# Patient Record
Sex: Female | Born: 1980 | Race: Black or African American | Hispanic: No | Marital: Single | Smoking: Former smoker
Health system: Southern US, Community
[De-identification: ages and names within clinical notes are randomized; demographics above are authoritative.]

## PROBLEM LIST (undated history)

## (undated) DIAGNOSIS — J45909 Unspecified asthma, uncomplicated: Secondary | ICD-10-CM

## (undated) DIAGNOSIS — J40 Bronchitis, not specified as acute or chronic: Secondary | ICD-10-CM

## (undated) HISTORY — PX: WISDOM TOOTH EXTRACTION: SHX21

---

## 2012-07-02 ENCOUNTER — Emergency Department (HOSPITAL_COMMUNITY)
Admission: EM | Admit: 2012-07-02 | Discharge: 2012-07-02 | Disposition: A | Discharge status: Home / Self Care | Payer: Self-pay | Attending: Emergency Medicine | Admitting: Emergency Medicine

## 2012-07-02 ENCOUNTER — Encounter (HOSPITAL_COMMUNITY): Payer: Self-pay | Admitting: *Deleted

## 2012-07-02 DIAGNOSIS — Z8709 Personal history of other diseases of the respiratory system: Secondary | ICD-10-CM | POA: Insufficient documentation

## 2012-07-02 DIAGNOSIS — J45909 Unspecified asthma, uncomplicated: Secondary | ICD-10-CM | POA: Insufficient documentation

## 2012-07-02 DIAGNOSIS — H6120 Impacted cerumen, unspecified ear: Secondary | ICD-10-CM | POA: Insufficient documentation

## 2012-07-02 DIAGNOSIS — F17200 Nicotine dependence, unspecified, uncomplicated: Secondary | ICD-10-CM | POA: Insufficient documentation

## 2012-07-02 HISTORY — DX: Bronchitis, not specified as acute or chronic: J40

## 2012-07-02 HISTORY — DX: Unspecified asthma, uncomplicated: J45.909

## 2012-07-02 MED ORDER — OXYCODONE-ACETAMINOPHEN 5-325 MG PO TABS
1.0000 | ORAL_TABLET | Freq: Once | ORAL | Status: AC
  Administered 2012-07-02: 1 via ORAL
  Filled 2012-07-02: qty 1

## 2012-07-02 NOTE — ED Notes (Signed)
Patient presents with c/o right ear ache.  Have tried flushing the ear with peroxide, ear wax drops, etc without success.  Denies drainage from ear.  States it hurts more when she is outside in the wind.

## 2012-07-02 NOTE — ED Provider Notes (Signed)
History     CSN: [id number]  Arrival date & time 07/02/12  0459   First MD Initiated Contact with Patient 07/02/12 [phone]      Chief Complaint  Patient presents with  . Otalgia    Patient is a 32 y.o. female presenting with ear pain. The history is provided by the patient.  Otalgia This is a new problem. The current episode started yesterday. There is pain in the right ear. The problem occurs constantly. The problem has been gradually worsening. There has been no fever. The pain is moderate. Pertinent negatives include no ear discharge, no headaches and no hearing loss.    Past Medical History  Diagnosis Date  . Asthma   . Bronchitis     History reviewed. No pertinent past surgical history.  History reviewed. No pertinent family history.  History  Substance Use Topics  . Smoking status: Current Every Day Smoker  . Smokeless tobacco: Not on file  . Alcohol Use: No    OB History    Grav Para Term Preterm Abortions TAB SAB Ect Mult Living                  Review of Systems  HENT: Positive for ear pain. Negative for hearing loss and ear discharge.   Neurological: Negative for headaches.    Allergies  Review of patient's allergies indicates no known allergies.  Home Medications  No current outpatient prescriptions on file.  BP 105/69  Pulse 72  Resp 18  SpO2 100%  Physical Exam CONSTITUTIONAL: Well developed/well nourished HEAD AND FACE: Normocephalic/atraumatic ENMT: Mucous membranes moist. Cerumen impaction noted to right ear canal.  Cerumen noted to left ear NECK: supple no meningeal signs CV: S1/S2 noted, no murmurs/rubs/gallops noted LUNGS: Lungs are clear to auscultation bilaterally, no apparent distress ABDOMEN: soft, nontender, no rebound or guarding NEURO: Pt is awake/alert, moves all extremitiesx4 EXTREMITIES: pulses normal, full ROM SKIN: warm, color normal PSYCH: no abnormalities of mood noted  ED Course  Procedures   Ear irrigation to  right ear by nurse.  Pt tolerated well.  She still has cerumen noted but is improved.  She requests d/c home.    1. Cerumen impaction       MDM  Nursing notes including past medical history and social history reviewed and considered in documentation         Joya Gaskins, MD 07/02/12 [phone]

## 2012-07-02 NOTE — ED Notes (Signed)
Pt states her right ear has been hurting for the past 2 days. Pt has been trying to flush out ear with peroxide and ear wax removal drops. Pt states that she wasn't able to sleep due to pain and pain increases with wind etc.

## 2012-07-02 NOTE — ED Notes (Signed)
Left ear irrigated with water and peroxide.  Several large pieces of wax removed.  Patient tolerated procedure well.

## 2013-08-03 ENCOUNTER — Encounter (HOSPITAL_COMMUNITY): Payer: Self-pay | Admitting: Emergency Medicine

## 2013-08-03 ENCOUNTER — Emergency Department (HOSPITAL_COMMUNITY)
Admission: EM | Admit: 2013-08-03 | Discharge: 2013-08-03 | Disposition: A | Discharge status: Home / Self Care | Payer: Self-pay | Attending: Emergency Medicine | Admitting: Emergency Medicine

## 2013-08-03 ENCOUNTER — Telehealth (HOSPITAL_COMMUNITY): Payer: Self-pay

## 2013-08-03 DIAGNOSIS — F17200 Nicotine dependence, unspecified, uncomplicated: Secondary | ICD-10-CM | POA: Insufficient documentation

## 2013-08-03 DIAGNOSIS — B309 Viral conjunctivitis, unspecified: Secondary | ICD-10-CM | POA: Insufficient documentation

## 2013-08-03 DIAGNOSIS — J45909 Unspecified asthma, uncomplicated: Secondary | ICD-10-CM | POA: Insufficient documentation

## 2013-08-03 DIAGNOSIS — H109 Unspecified conjunctivitis: Secondary | ICD-10-CM

## 2013-08-03 MED ORDER — NEOMYCIN-POLYMYXIN-HC 3.5-10000-1 OP SUSP: 2.0000 [drp] | OPHTHALMIC | Status: DC

## 2013-08-03 NOTE — ED Notes (Signed)
Pharmacy calling having difficult time finding eye solution.  [facility].

## 2013-08-03 NOTE — ED Provider Notes (Signed)
10:46 AM 08/03/2013 - This provider received a phone call from the Flow Manager who reported that patient cannot get medication for her conjunctivitis secondary to medication not being in stock and patient is unable to afford the medication. This provider spoke with pharmacist, Silverio LayYao - order placed for Erythromycin ophthalmic ointment to be placed 4 times per day to affected right eye for 5 days. Patient was able to afford and this medication was in stock.   Raymon MuttonMarissa Nissim Fleischer, PA-C 08/03/13 1808

## 2013-08-03 NOTE — ED Provider Notes (Signed)
Medical screening examination/treatment/procedure(s) were performed by non-physician practitioner and as supervising physician I was immediately available for consultation/collaboration.  EKG Interpretation   None        Ethelda ChickMartha K Linker, MD 08/03/13 [phone]

## 2013-08-03 NOTE — ED Notes (Signed)
Pt. reports bilateral eye redness with drainage for several days , denies injury , slight blurred vision .

## 2013-08-03 NOTE — Discharge Instructions (Signed)

## 2013-08-03 NOTE — ED Provider Notes (Signed)
CSN: [id number]     Arrival date & time 08/03/13  0004 History   None    Chief Complaint  Patient presents with  . Conjunctivitis   (Consider location/radiation/quality/duration/timing/severity/associated sxs/prior Treatment) HPI Comments: Patient with complaint of bilateral eye redness, with swelling, eye mattering.  Began on the right I personally 7 days ago and progressed to left.  Boyfriend with the same symptoms.  Patient with complaint of blurry vision in the right eye.  Visual acuity is equal bilaterally in both eyes.  Patient is a 33 y.o. female presenting with conjunctivitis. The history is provided by the patient. No language interpreter was used.  Conjunctivitis This is a new problem. The current episode started in the past 7 days. The problem occurs constantly. The problem has been gradually worsening. Pertinent negatives include no abdominal pain, anorexia, arthralgias, change in bowel habit, chest pain, chills, congestion, coughing, diaphoresis, fatigue, fever, headaches, joint swelling, myalgias, nausea, neck pain, numbness, rash, sore throat, swollen glands, urinary symptoms, vertigo, visual change, vomiting or weakness. Nothing aggravates the symptoms. She has tried nothing for the symptoms.    Past Medical History  Diagnosis Date  . Asthma   . Bronchitis    History reviewed. No pertinent past surgical history. No family history on file. History  Substance Use Topics  . Smoking status: Current Every Day Smoker  . Smokeless tobacco: Not on file  . Alcohol Use: No   OB History   Grav Para Term Preterm Abortions TAB SAB Ect Mult Living                 Review of Systems  Constitutional: Negative for fever, chills, diaphoresis and fatigue.  HENT: Negative for congestion and sore throat.   Eyes: Positive for discharge, redness, itching and visual disturbance. Negative for photophobia and pain.  Respiratory: Negative for cough.   Cardiovascular: Negative for chest pain.   Gastrointestinal: Negative for nausea, vomiting, abdominal pain, anorexia and change in bowel habit.  Musculoskeletal: Negative for arthralgias, joint swelling, myalgias and neck pain.  Skin: Negative for rash.  Neurological: Negative for vertigo, weakness, numbness and headaches.    Allergies  Review of patient's allergies indicates no known allergies.  Home Medications   Current Outpatient Rx  Name  Route  Sig  Dispense  Refill  . diphenhydramine-acetaminophen (TYLENOL PM) 25-500 MG TABS   Oral   Take 1 tablet by mouth at bedtime as needed (for sleep).          BP 108/70  Pulse 82  Temp(Src) 98 F (36.7 C) (Oral)  Resp 18  SpO2 100%  LMP 08/01/2013 Physical Exam  Constitutional: She is oriented to person, place, and time. She appears well-developed and well-nourished. No distress.  HENT:  Head: Normocephalic and atraumatic.  Eyes: EOM are normal. Pupils are equal, round, and reactive to light. Right eye exhibits discharge. Right eye exhibits no exudate. Left eye exhibits discharge. Left eye exhibits no exudate. Right conjunctiva is injected. Left conjunctiva is injected. No scleral icterus.  Neck: Normal range of motion.  Cardiovascular: Normal rate, regular rhythm and normal heart sounds.  Exam reveals no gallop and no friction rub.   No murmur heard. Pulmonary/Chest: Effort normal and breath sounds normal. No respiratory distress.  Abdominal: Soft. Bowel sounds are normal. She exhibits no distension and no mass. There is no tenderness. There is no guarding.  Neurological: She is alert and oriented to person, place, and time.  Skin: Skin is warm and dry. She is  not diaphoretic.    ED Course  Procedures (including critical care time) Labs Review Labs Reviewed - No data to display Imaging Review No results found.  EKG Interpretation   None       MDM   1. Conjunctivitis   Viral conjunctivitis  Patient presentation consistent with viral conjunctivitis.  No  purulent discharge, corneal abrasions, entrapment, consensual photophobia, or dendritic staining with fluorescein study.  Presentation non-concerning for iritis, bacterial conjunctivitis, corneal abrasions, or HSV. Personal hygiene and frequent handwashing discussed.  Patient advised to followup with ophthalmologist if symptoms persist or worsen in any way including vision change or purulent discharge.  Patient verbalizes understanding and is agreeable with plan.    Arthor CaptainAbigail Calan Doren, PA-C 08/03/13 0119

## 2013-08-08 NOTE — ED Provider Notes (Signed)
Medical screening examination/treatment/procedure(s) were performed by non-physician practitioner and as supervising physician I was immediately available for consultation/collaboration.  EKG Interpretation   None        Raeford RazorStephen Chevon Laufer, MD 08/08/13 1450

## 2013-11-07 ENCOUNTER — Encounter (HOSPITAL_COMMUNITY): Payer: Self-pay | Admitting: Emergency Medicine

## 2013-11-07 ENCOUNTER — Emergency Department (HOSPITAL_COMMUNITY)
Admission: EM | Admit: 2013-11-07 | Discharge: 2013-11-07 | Discharge status: Against Medical Advice | Payer: Self-pay | Attending: Emergency Medicine | Admitting: Emergency Medicine

## 2013-11-07 DIAGNOSIS — R51 Headache: Secondary | ICD-10-CM | POA: Insufficient documentation

## 2013-11-07 DIAGNOSIS — J45909 Unspecified asthma, uncomplicated: Secondary | ICD-10-CM | POA: Insufficient documentation

## 2013-11-07 DIAGNOSIS — R059 Cough, unspecified: Secondary | ICD-10-CM | POA: Insufficient documentation

## 2013-11-07 DIAGNOSIS — F17200 Nicotine dependence, unspecified, uncomplicated: Secondary | ICD-10-CM | POA: Insufficient documentation

## 2013-11-07 DIAGNOSIS — M549 Dorsalgia, unspecified: Secondary | ICD-10-CM | POA: Insufficient documentation

## 2013-11-07 DIAGNOSIS — R630 Anorexia: Secondary | ICD-10-CM | POA: Insufficient documentation

## 2013-11-07 DIAGNOSIS — R05 Cough: Secondary | ICD-10-CM | POA: Insufficient documentation

## 2013-11-07 DIAGNOSIS — R52 Pain, unspecified: Secondary | ICD-10-CM | POA: Insufficient documentation

## 2013-11-07 NOTE — ED Notes (Addendum)
Pt complains of flu-like symptoms, productive cough and generalized body aches worse in L side of head and back. Decreased appetite, but able to keep down fluids. No sore throat

## 2013-11-07 NOTE — ED Provider Notes (Signed)
Patient left after triage.  I did not see or evaluate patient.  Garlon HatchetLisa M Jaymond Waage, PA-C 11/07/13 1739  Rolland PorterMark James, MD 07/24/14 [phone]

## 2013-11-07 NOTE — ED Notes (Signed)
Pt not in room when PA went to see pt. Pt walked out of ED.

## 2013-11-08 ENCOUNTER — Emergency Department (INDEPENDENT_AMBULATORY_CARE_PROVIDER_SITE_OTHER)
Admission: EM | Admit: 2013-11-08 | Discharge: 2013-11-08 | Disposition: A | Discharge status: Home / Self Care | Payer: Self-pay | Source: Home / Self Care | Attending: Family Medicine | Admitting: Family Medicine

## 2013-11-08 ENCOUNTER — Encounter (HOSPITAL_COMMUNITY): Payer: Self-pay | Admitting: Emergency Medicine

## 2013-11-08 DIAGNOSIS — J45909 Unspecified asthma, uncomplicated: Secondary | ICD-10-CM

## 2013-11-08 DIAGNOSIS — J329 Chronic sinusitis, unspecified: Secondary | ICD-10-CM

## 2013-11-08 MED ORDER — ALBUTEROL SULFATE HFA 108 (90 BASE) MCG/ACT IN AERS
INHALATION_SPRAY | RESPIRATORY_TRACT | Status: AC
Start: 2013-11-08 — End: 2013-11-08
  Filled 2013-11-08: qty 6.7

## 2013-11-08 MED ORDER — AEROCHAMBER PLUS FLO-VU LARGE MISC
1.0000 | Freq: Once | Status: AC
  Administered 2013-11-08: 1

## 2013-11-08 MED ORDER — IPRATROPIUM-ALBUTEROL 0.5-2.5 (3) MG/3ML IN SOLN
3.0000 mL | Freq: Once | RESPIRATORY_TRACT | Status: AC
  Administered 2013-11-08: 3 mL via RESPIRATORY_TRACT

## 2013-11-08 MED ORDER — ALBUTEROL SULFATE HFA 108 (90 BASE) MCG/ACT IN AERS
1.0000 | INHALATION_SPRAY | RESPIRATORY_TRACT | Status: DC | PRN
Start: 2013-11-08 — End: 2013-11-08
  Administered 2013-11-08: 2 via RESPIRATORY_TRACT

## 2013-11-08 MED ORDER — PREDNISONE 10 MG PO TABS: ORAL_TABLET | ORAL | Status: DC

## 2013-11-08 MED ORDER — AMOXICILLIN 875 MG PO TABS: 875.0000 mg | ORAL_TABLET | Freq: Two times a day (BID) | ORAL | Status: DC

## 2013-11-08 MED ORDER — FLUTICASONE PROPIONATE 50 MCG/ACT NA SUSP: 2.0000 | Freq: Two times a day (BID) | NASAL | Status: DC

## 2013-11-08 MED ORDER — IPRATROPIUM-ALBUTEROL 0.5-2.5 (3) MG/3ML IN SOLN
RESPIRATORY_TRACT | Status: AC
  Filled 2013-11-08: qty 3

## 2013-11-08 NOTE — ED Notes (Signed)
Accessed record for patient call-2 of 3 scripts are at pharmacy, no prednisone at pharmacy

## 2013-11-08 NOTE — ED Provider Notes (Signed)
CSN: [id number]     Arrival date & time 11/08/13  [id number] History   First MD Initiated Contact with Patient 11/08/13 [phone]     Chief Complaint  Patient presents with  . Facial Pain   (Consider location/radiation/quality/duration/timing/severity/associated sxs/prior Treatment) HPI Comments: 33 year old female presents complaining of one week of severe left-sided headache, sinus pressure, facial swelling, fatigue, sore throat. Her symptoms have been gradually worsening. She also has decreased appetite and a productive cough. She has been taking over-the-counter medications without relief of her symptoms. She has a history of "bad sinuses," stating that she occasionally gets sinus infections. No recent travel or sick contacts.   Past Medical History  Diagnosis Date  . Asthma   . Bronchitis    History reviewed. No pertinent past surgical history. History reviewed. No pertinent family history. History  Substance Use Topics  . Smoking status: Current Every Day Smoker  . Smokeless tobacco: Not on file  . Alcohol Use: No   OB History   Grav Para Term Preterm Abortions TAB SAB Ect Mult Living                 Review of Systems  Constitutional: Positive for chills and fatigue.  HENT: Positive for congestion, facial swelling, postnasal drip, rhinorrhea, sinus pressure and sore throat. Negative for ear pain.   Eyes: Positive for photophobia.  Respiratory: Positive for cough. Negative for shortness of breath.   Cardiovascular: Negative for chest pain.  Gastrointestinal: Negative for nausea, vomiting, abdominal pain and diarrhea.  Musculoskeletal: Negative for neck pain.  Skin: Negative for rash.  All other systems reviewed and are negative.   Allergies  Review of patient's allergies indicates no known allergies.  Home Medications   Prior to Admission medications   Medication Sig Start Date End Date Taking? Authorizing Provider  Aspirin-Acetaminophen-Caffeine (GOODY HEADACHE PO) Take 1  Package by mouth every 6 (six) hours as needed (pain).    Historical Provider, MD   BP 112/72  Pulse 108  Temp(Src) 98.5 F (36.9 C) (Oral)  Resp 14  SpO2 100%  LMP 10/30/2013 Physical Exam  Nursing note and vitals reviewed. Constitutional: She is oriented to person, place, and time. Vital signs are normal. She appears well-developed and well-nourished. No distress.  HENT:  Head: Normocephalic and atraumatic.  Nose: Mucosal edema and rhinorrhea present. Right sinus exhibits no maxillary sinus tenderness and no frontal sinus tenderness. Left sinus exhibits frontal sinus tenderness. Left sinus exhibits no maxillary sinus tenderness.  Mouth/Throat: Uvula is midline, oropharynx is clear and moist and mucous membranes are normal.  Eyes: Conjunctivae are normal. Right eye exhibits no discharge. Left eye exhibits no discharge.  Neck: Normal range of motion. Neck supple.  Cardiovascular: Normal rate, regular rhythm and normal heart sounds.   Pulmonary/Chest: Effort normal. No accessory muscle usage. Not tachypneic. No respiratory distress. She has wheezes (scattered). She has no rhonchi. She has no rales.  Lymphadenopathy:    She has no cervical adenopathy.  Neurological: She is alert and oriented to person, place, and time. She has normal strength. Coordination normal.  Skin: Skin is warm and dry. No rash noted. She is not diaphoretic.  Psychiatric: She has a normal mood and affect. Judgment normal.    ED Course  Procedures (including critical care time) Labs Review Labs Reviewed - No data to display  Imaging Review No results found.  Adventitious lung sounds completely resolved with one breathing treatment with DuoNeb.    MDM   1. Sinusitis  2. RAD (reactive airway disease)    Treating for sinusitis, will also provide albuterol inhaler airway disease associated with this illness. Followup if no improvement in a few days.  Meds ordered this encounter  Medications  .  ipratropium-albuterol (DUONEB) 0.5-2.5 (3) MG/3ML nebulizer solution 3 mL    Sig:   . albuterol (PROVENTIL HFA;VENTOLIN HFA) 108 (90 BASE) MCG/ACT inhaler 1-2 puff    Sig:   . AEROCHAMBER PLUS FLO-VU LARGE MISC 1 each    Sig:   . amoxicillin (AMOXIL) 875 MG tablet    Sig: Take 1 tablet (875 mg total) by mouth 2 (two) times daily.    Dispense:  14 tablet    Refill:  0    Order Specific Question:  Supervising Provider    Answer:  Clementeen GrahamOREY, EVAN, S [id number]]  . predniSONE (DELTASONE) 10 MG tablet    Sig: 4 tabs PO QD for 4 days; 3 tabs PO QD for 3 days; 2 tabs PO QD for 2 days; 1 tab PO QD for 1 day    Dispense:  30 tablet    Refill:  0    Order Specific Question:  Supervising Provider    Answer:  Clementeen GrahamOREY, EVAN, S [[id number]]  . fluticasone (FLONASE) 50 MCG/ACT nasal spray    Sig: Place 2 sprays into both nostrils 2 (two) times daily. Decrease to 2 sprays/nostril daily after 5 days    Dispense:  16 g    Refill:  2    Order Specific Question:  Supervising Provider    Answer:  Clementeen GrahamOREY, EVAN, Kathie RhodesS [[id number]]       Graylon GoodZachary H Maddyx Wieck, PA-C 11/08/13 [phone]

## 2013-11-08 NOTE — Discharge Instructions (Signed)
Sinusitis Sinusitis is redness, soreness, and swelling (inflammation) of the paranasal sinuses. Paranasal sinuses are air pockets within the bones of your face (beneath the eyes, the middle of the forehead, or above the eyes). In healthy paranasal sinuses, mucus is able to drain out, and air is able to circulate through them by way of your nose. However, when your paranasal sinuses are inflamed, mucus and air can become trapped. This can allow bacteria and other germs to grow and cause infection. Sinusitis can develop quickly and last only a short time (acute) or continue over a long period (chronic). Sinusitis that lasts for more than 12 weeks is considered chronic.  CAUSES  Causes of sinusitis include:  Allergies.  Structural abnormalities, such as displacement of the cartilage that separates your nostrils (deviated septum), which can decrease the air flow through your nose and sinuses and affect sinus drainage.  Functional abnormalities, such as when the small hairs (cilia) that line your sinuses and help remove mucus do not work properly or are not present. SYMPTOMS  Symptoms of acute and chronic sinusitis are the same. The primary symptoms are pain and pressure around the affected sinuses. Other symptoms include:  Upper toothache.  Earache.  Headache.  Bad breath.  Decreased sense of smell and taste.  A cough, which worsens when you are lying flat.  Fatigue.  Fever.  Thick drainage from your nose, which often is green and may contain pus (purulent).  Swelling and warmth over the affected sinuses. DIAGNOSIS  Your caregiver will perform a physical exam. During the exam, your caregiver may:  Look in your nose for signs of abnormal growths in your nostrils (nasal polyps).  Tap over the affected sinus to check for signs of infection.  View the inside of your sinuses (endoscopy) with a special imaging device with a light attached (endoscope), which is inserted into your  sinuses. If your caregiver suspects that you have chronic sinusitis, one or more of the following tests may be recommended:  Allergy tests.  Nasal culture A sample of mucus is taken from your nose and sent to a lab and screened for bacteria.  Nasal cytology A sample of mucus is taken from your nose and examined by your caregiver to determine if your sinusitis is related to an allergy. TREATMENT  Most cases of acute sinusitis are related to a viral infection and will resolve on their own within 10 days. Sometimes medicines are prescribed to help relieve symptoms (pain medicine, decongestants, nasal steroid sprays, or saline sprays).  However, for sinusitis related to a bacterial infection, your caregiver will prescribe antibiotic medicines. These are medicines that will help kill the bacteria causing the infection.  Rarely, sinusitis is caused by a fungal infection. In theses cases, your caregiver will prescribe antifungal medicine. For some cases of chronic sinusitis, surgery is needed. Generally, these are cases in which sinusitis recurs more than 3 times per year, despite other treatments. HOME CARE INSTRUCTIONS   Drink plenty of water. Water helps thin the mucus so your sinuses can drain more easily.  Use a humidifier.  Inhale steam 3 to 4 times a day (for example, sit in the bathroom with the shower running).  Apply a warm, moist washcloth to your face 3 to 4 times a day, or as directed by your caregiver.  Use saline nasal sprays to help moisten and clean your sinuses.  Take over-the-counter or prescription medicines for pain, discomfort, or fever only as directed by your caregiver. [place]  CARE IF:  You have increasing pain or severe headaches.  You have nausea, vomiting, or drowsiness.  You have swelling around your face.  You have vision problems.  You have a stiff neck.  You have difficulty breathing. MAKE SURE YOU:   Understand these  instructions.  Will watch your condition.  Will get help right away if you are not doing well or get worse. Document Released: 06/17/2005 Document Revised: 09/09/2011 Document Reviewed: 07/02/2011 [facility] Patient Information 2014 [place], [place].  Bronchospasm, Adult A bronchospasm is a spasm or tightening of the airways going into the lungs. During a bronchospasm breathing becomes more difficult because the airways get smaller. When this happens there can be coughing, a whistling sound when breathing (wheezing), and difficulty breathing. Bronchospasm is often associated with asthma, but not all patients who experience a bronchospasm have asthma. CAUSES  A bronchospasm is caused by inflammation or irritation of the airways. The inflammation or irritation may be triggered by:   Allergies (such as to animals, pollen, food, or mold). Allergens that cause bronchospasm may cause wheezing immediately after exposure or many hours later.   Infection. Viral infections are believed to be the most common cause of bronchospasm.   Exercise.   Irritants (such as pollution, cigarette smoke, strong odors, aerosol sprays, and paint fumes).   Weather changes. Winds increase molds and pollens in the air. Rain refreshes the air by washing irritants out. Cold air may cause inflammation.   Stress and emotional upset.  SIGNS AND SYMPTOMS   Wheezing.   Excessive nighttime coughing.   Frequent or severe coughing with a simple cold.   Chest tightness.   Shortness of breath.  DIAGNOSIS  Bronchospasm is usually diagnosed through a history and physical exam. Tests, such as chest X-rays, are sometimes done to look for other conditions. TREATMENT   Inhaled medicines can be given to open up your airways and help you breathe. The medicines can be given using either an inhaler or a nebulizer machine.  Corticosteroid medicines may be given for severe bronchospasm, usually when it is associated with  asthma. HOME CARE INSTRUCTIONS   Always have a plan prepared for seeking medical care. Know when to call your health care provider and local emergency services (911 in the [place] Know where you can access local emergency care.  Only take medicines as directed by your health care provider.  If you were prescribed an inhaler or nebulizer machine, ask your health care provider to explain how to use it correctly. Always use a spacer with your inhaler if you were given one.  It is necessary to remain calm during an attack. Try to relax and breathe more slowly.  Control your home environment in the following ways:   Change your heating and air conditioning filter at least once a month.   Limit your use of fireplaces and wood stoves.  Do not smoke and do not allow smoking in your home.   Avoid exposure to perfumes and fragrances.   Get rid of pests (such as roaches and mice) and their droppings.   Throw away plants if you see mold on them.   Keep your house clean and dust free.   Replace carpet with wood, tile, or vinyl flooring. Carpet can trap dander and dust.   Use allergy-proof pillows, mattress covers, and box spring covers.   Wash bed sheets and blankets every week in hot water and dry them in a dryer.   Use blankets that are made of polyester  or cotton.   Wash hands frequently. SEEK MEDICAL CARE IF:   You have muscle aches.   You have chest pain.   The sputum changes from clear or white to yellow, green, gray, or bloody.   The sputum you cough up gets thicker.   There are problems that may be related to the medicine you are given, such as a rash, itching, swelling, or trouble breathing.  SEEK IMMEDIATE MEDICAL CARE IF:   You have worsening wheezing and coughing even after taking your prescribed medicines.   You have increased difficulty breathing.   You develop severe chest pain. MAKE SURE YOU:   Understand these instructions.  Will watch  your condition.  Will get help right away if you are not doing well or get worse. Document Released: 06/20/2003 Document Revised: 02/17/2013 Document Reviewed: 12/07/2012 [facility] Patient Information 2014 [place], [place].

## 2013-11-08 NOTE — ED Notes (Signed)
C/o  Sinus pressure and pain.   Left sided facial swelling.  Productive cough with green/yellow sputum.  Decreased appetite.  X 1 wk.  Denies n/v/d.  No relief with otc meds.

## 2013-11-11 NOTE — ED Provider Notes (Signed)
Medical screening examination/treatment/procedure(s) were performed by a resident physician or non-physician practitioner and as the supervising physician I was immediately available for consultation/collaboration.  Sherryll Skoczylas, MD    Tanganika Barradas S Cammi Consalvo, MD 11/11/13 0815 

## 2015-03-15 ENCOUNTER — Inpatient Hospital Stay (HOSPITAL_COMMUNITY)
Admission: AD | Admit: 2015-03-15 | Discharge: 2015-03-15 | Disposition: A | Discharge status: Home / Self Care | Payer: Medicaid Other | Source: Ambulatory Visit | Attending: Obstetrics & Gynecology | Admitting: Obstetrics & Gynecology

## 2015-03-15 ENCOUNTER — Encounter (HOSPITAL_COMMUNITY): Payer: Self-pay | Admitting: Emergency Medicine

## 2015-03-15 ENCOUNTER — Emergency Department (INDEPENDENT_AMBULATORY_CARE_PROVIDER_SITE_OTHER)
Admission: EM | Admit: 2015-03-15 | Discharge: 2015-03-15 | Disposition: A | Discharge status: Home / Self Care | Payer: Self-pay | Source: Home / Self Care | Attending: Family Medicine | Admitting: Family Medicine

## 2015-03-15 ENCOUNTER — Encounter (HOSPITAL_COMMUNITY): Payer: Self-pay | Admitting: *Deleted

## 2015-03-15 ENCOUNTER — Inpatient Hospital Stay (HOSPITAL_COMMUNITY): Payer: Medicaid Other

## 2015-03-15 DIAGNOSIS — F17200 Nicotine dependence, unspecified, uncomplicated: Secondary | ICD-10-CM | POA: Diagnosis not present

## 2015-03-15 DIAGNOSIS — R102 Pelvic and perineal pain: Secondary | ICD-10-CM

## 2015-03-15 DIAGNOSIS — R109 Unspecified abdominal pain: Secondary | ICD-10-CM | POA: Insufficient documentation

## 2015-03-15 DIAGNOSIS — R51 Headache: Secondary | ICD-10-CM | POA: Diagnosis not present

## 2015-03-15 DIAGNOSIS — Z7982 Long term (current) use of aspirin: Secondary | ICD-10-CM | POA: Diagnosis not present

## 2015-03-15 DIAGNOSIS — A599 Trichomoniasis, unspecified: Secondary | ICD-10-CM

## 2015-03-15 DIAGNOSIS — R1033 Periumbilical pain: Secondary | ICD-10-CM

## 2015-03-15 LAB — WET PREP, GENITAL: YEAST WET PREP: NONE SEEN

## 2015-03-15 LAB — POCT URINALYSIS DIP (DEVICE)
BILIRUBIN URINE: NEGATIVE
GLUCOSE, UA: NEGATIVE mg/dL
Ketones, ur: NEGATIVE mg/dL
NITRITE: NEGATIVE
Protein, ur: NEGATIVE mg/dL
Specific Gravity, Urine: 1.02 (ref 1.005–1.030)
UROBILINOGEN UA: 2 mg/dL — AB (ref 0.0–1.0)
pH: 6.5 (ref 5.0–8.0)

## 2015-03-15 LAB — CBC
HCT: 42.7 % (ref 36.0–46.0)
Hemoglobin: 15.1 g/dL — ABNORMAL HIGH (ref 12.0–15.0)
MCH: 33.9 pg (ref 26.0–34.0)
MCHC: 35.4 g/dL (ref 30.0–36.0)
MCV: 95.7 fL (ref 78.0–100.0)
PLATELETS: 225 10*3/uL (ref 150–400)
RBC: 4.46 MIL/uL (ref 3.87–5.11)
RDW: 12.4 % (ref 11.5–15.5)
WBC: 16.6 10*3/uL — ABNORMAL HIGH (ref 4.0–10.5)

## 2015-03-15 LAB — POCT PREGNANCY, URINE: PREG TEST UR: NEGATIVE

## 2015-03-15 MED ORDER — METRONIDAZOLE 500 MG PO TABS
2000.0000 mg | ORAL_TABLET | Freq: Once | ORAL | Status: AC
  Administered 2015-03-15: 2000 mg via ORAL
  Filled 2015-03-15: qty 4

## 2015-03-15 MED ORDER — ONDANSETRON 8 MG PO TBDP
8.0000 mg | ORAL_TABLET | Freq: Once | ORAL | Status: AC
  Administered 2015-03-15: 8 mg via ORAL
  Filled 2015-03-15: qty 1

## 2015-03-15 NOTE — ED Notes (Signed)
General abdominal pain and reports nausea and vomiting that started Sunday 9/11.  Denies diarrhea, denies urinary symptoms, poor appetite.  Last bm normal, last bm was yesterday

## 2015-03-15 NOTE — MAU Provider Note (Signed)
History     CSN: [id number]  Arrival date and time: 03/15/15 4098   First Provider Initiated Contact with Patient 03/15/15 2017      Chief Complaint  Patient presents with  . Abdominal Pain   HPI Caitlin Owens 34 y.o. [id number] nonpregnant female presents complaining of abdominal pain that started 4 days ago.  It feels like menstrual cramps and her period was ongoing at that time.  However her period is nearly over and the cramping is worsening.  She has tried Tylenol, Midol, Advil, Advil pm without good relief.  Pain worse at night.  She is unable to keep any food down.  She vomited 4 times today.  She ate bacon and eggs, bowl of cereal, stir fry, all which were vomited back up.  She was able to keep down crackers and ginger ale mid-day.  She is not on any hormones or other contraceptive.  She has headaches routinely.  She denies weakness, SOB, CP, syncope.  No meds since 3am today.  No concerns with urinating or pooping. OB History    Gravida Para Term Preterm AB TAB SAB Ectopic Multiple Living   Past Medical History  Diagnosis Date  . Asthma   . Bronchitis     Past Surgical History  Procedure Laterality Date  . Wisdom tooth extraction      No family history on file.  Social History  Substance Use Topics  . Smoking status: Current Some Day Smoker    Types: Cigars  . Smokeless tobacco: None  . Alcohol Use: No    Allergies: No Known Allergies  Prescriptions prior to admission  Medication Sig Dispense Refill Last Dose  . amoxicillin (AMOXIL) 875 MG tablet Take 1 tablet (875 mg total) by mouth 2 (two) times daily. (Patient not taking: Reported on 03/15/2015) 14 tablet 0   . Aspirin-Acetaminophen-Caffeine (GOODY HEADACHE PO) Take 1 Package by mouth every 6 (six) hours as needed (pain).   Unknown at Unknown time  . fluticasone (FLONASE) 50 MCG/ACT nasal spray Place 2 sprays into both nostrils 2 (two) times daily. Decrease to 2 sprays/nostril daily after  5 days 16 g 2 Unknown at Unknown time  . predniSONE (DELTASONE) 10 MG tablet 4 tabs PO QD for 4 days; 3 tabs PO QD for 3 days; 2 tabs PO QD for 2 days; 1 tab PO QD for 1 day 30 tablet 0 Unknown at Unknown time    ROS Pertinent ROS in HPI.  All other systems are negative.   Physical Exam   Blood pressure 111/74, pulse 74, temperature 98.9 F (37.2 C), temperature source Oral, resp. rate 16, height  (1.6 m), weight 54.885 kg (121 lb), last menstrual period 03/12/2015, SpO2 100 %.  Physical Exam  Constitutional: She is oriented to person, place, and time. She appears well-developed and well-nourished. No distress.  HENT:  Head: Normocephalic and atraumatic.  Eyes: EOM are normal.  Neck: Normal range of motion.  Cardiovascular: Normal rate.   Respiratory: No respiratory distress.  GI: Soft. She exhibits no mass. There is tenderness. There is no rebound and no guarding.  Periumbilical tenderness  Genitourinary:  Diffusely tender throughout speculum exam and bimanual exam.  Scant clear discharge. Small amt of blood in os.    Musculoskeletal: Normal range of motion.  Neurological: She is alert and oriented to person, place, and time.  Skin: Skin is warm and dry.  Psychiatric: She has a normal mood and affect.    MAU Course  Procedures Results for orders placed or performed during the hospital encounter of 03/15/15 (from the past 24 hour(s))  Wet prep, genital     Status: Abnormal   Collection Time: 03/15/15  8:30 PM  Result Value Ref Range   Yeast Wet Prep HPF POC NONE SEEN NONE SEEN   Trich, Wet Prep FEW (A) NONE SEEN   Clue Cells Wet Prep HPF POC FEW (A) NONE SEEN   WBC, Wet Prep HPF POC FEW (A) NONE SEEN  CBC     Status: Abnormal   Collection Time: 03/15/15  8:57 PM  Result Value Ref Range   WBC 16.6 (H) 4.0 - 10.5 K/uL   RBC 4.46 3.87 - 5.11 MIL/uL   Hemoglobin 15.1 (H) 12.0 - 15.0 g/dL   HCT 40.9 81.1 - 91.4 %   MCV 95.7 78.0 - 100.0 fL   MCH 33.9 26.0 - 34.0 pg    MCHC 35.4 30.0 - 36.0 g/dL   RDW 78.2 95.6 - 21.3 %   Platelets 225 150 - 400 K/uL   US Transvaginal Non-ob  03/15/2015   CLINICAL DATA:  Pelvic pain for 3 days.  EXAM: TRANSABDOMINAL AND TRANSVAGINAL ULTRASOUND OF PELVIS  TECHNIQUE: Both transabdominal and transvaginal ultrasound examinations of the pelvis were performed. Transabdominal technique was performed for global imaging of the pelvis including uterus, ovaries, adnexal regions, and pelvic cul-de-sac. It was necessary to proceed with endovaginal exam following the transabdominal exam to visualize the endometrium, left and right ovary.  COMPARISON:  None  FINDINGS: Uterus  Measurements: 6.1 x 4.6 x 4.7 cm. No fibroids or other mass visualized. The uterus is retroverted.  Endometrium  Thickness: 10 mm.  No focal abnormality visualized.  Right ovary  Measurements: 3.5 x 1.6 x 1.6 cm. There are physiologic follicles. Blood flow is noted. No adnexal mass.  Left ovary  Measurements: 4.3 x 1.8 x 2.0 cm. There are physiologic follicles. Blood flow is noted. No adnexal mass.  Other findings  No free fluid.  IMPRESSION: Normal pelvic ultrasound.   Electronically Signed   By: Rubye Oaks M.D.   On: 03/15/2015 21:20   US Pelvis Complete  03/15/2015   CLINICAL DATA:  Pelvic pain for 3 days.  EXAM: TRANSABDOMINAL AND TRANSVAGINAL ULTRASOUND OF PELVIS  TECHNIQUE: Both transabdominal and transvaginal ultrasound examinations of the pelvis were performed. Transabdominal technique was performed for global imaging of the pelvis including uterus, ovaries, adnexal regions, and pelvic cul-de-sac. It was necessary to proceed with endovaginal exam following the transabdominal exam to visualize the endometrium, left and right ovary.  COMPARISON:  None  FINDINGS: Uterus  Measurements: 6.1 x 4.6 x 4.7 cm. No fibroids or other mass visualized. The uterus is retroverted.  Endometrium  Thickness: 10 mm.  No focal abnormality visualized.  Right ovary  Measurements: 3.5 x 1.6 x  1.6 cm. There are physiologic follicles. Blood flow is noted. No adnexal mass.  Left ovary  Measurements: 4.3 x 1.8 x 2.0 cm. There are physiologic follicles. Blood flow is noted. No adnexal mass.  Other findings  No free fluid.  IMPRESSION: Normal pelvic ultrasound.   Electronically Signed   By: Rubye Oaks M.D.   On: 03/15/2015 21:20     MDM UPT negative at urgent care Wet prep, GC collected. CBC and Pelvic U/S ordered.  Report given and care turned over to [facility], NP-C    Glyn Ade, Scot Jun  Wet prep positive for trich: Flagyl 2 gm given in MAU  Normal ultrasound Assessment and Plan  A: 1. Pelvic pain in female   2. Trichomoniasis    P: Discharge home GC/CT pending Trichomoniasis treated in MAU Go to local ED if worsening abdominal pain or fever Inform partner he needs treatment; no intercourse for 7 days after BOTH have been treated  Judeth Horn, NP 03/15/2015 9:45 PM

## 2015-03-15 NOTE — Discharge Instructions (Signed)
[facility] problem

## 2015-03-15 NOTE — Discharge Instructions (Signed)
Abdominal Pain °Many things can cause abdominal pain. Usually, abdominal pain is not caused by a disease and will improve without treatment. It can often be observed and treated at home. Your health care provider will do a physical exam and possibly order blood tests and X-rays to help determine the seriousness of your pain. However, in many cases, more time must pass before a clear cause of the pain can be found. Before that point, your health care provider may not know if you need more testing or further treatment. °HOME CARE INSTRUCTIONS  °Monitor your abdominal pain for any changes. The following actions may help to alleviate any discomfort you are experiencing: °· Only take over-the-counter or prescription medicines as directed by your health care provider. °· Do not take laxatives unless directed to do so by your health care provider. °· Try a clear liquid diet (broth, tea, or water) as directed by your health care provider. Slowly move to a bland diet as tolerated. °SEEK MEDICAL CARE IF: °· You have unexplained abdominal pain. °· You have abdominal pain associated with nausea or diarrhea. °· You have pain when you urinate or have a bowel movement. °· You experience abdominal pain that wakes you in the night. °· You have abdominal pain that is worsened or improved by eating food. °· You have abdominal pain that is worsened with eating fatty foods. °· You have a fever. °SEEK IMMEDIATE MEDICAL CARE IF:  °· Your pain does not go away within 2 hours. °· You keep throwing up (vomiting). °· Your pain is felt only in portions of the abdomen, such as the right side or the left lower portion of the abdomen. °· You pass bloody or black tarry stools. °MAKE SURE YOU: °· Understand these instructions.   °· Will watch your condition.   °· Will get help right away if you are not doing well or get worse.   °Document Released: 03/27/2005 Document Revised: 06/22/2013 Document Reviewed: 02/24/2013 °ExitCare® Patient Information  ©2015 [employer]. This information is not intended to replace advice given to you by your health care provider. Make sure you discuss any questions you have with your health care provider. ° °Trichomoniasis °Trichomoniasis is an infection caused by an organism called Trichomonas. The infection can affect both women and men. In women, the outer female genitalia and the vagina are affected. In men, the penis is mainly affected, but the prostate and other reproductive organs can also be involved. Trichomoniasis is a sexually transmitted infection (STI) and is most often passed to another person through sexual contact.  °RISK FACTORS °· Having unprotected sexual intercourse. °· Having sexual intercourse with an infected partner. °SIGNS AND SYMPTOMS  °Symptoms of trichomoniasis in women include: °· Abnormal gray-green frothy vaginal discharge. °· Itching and irritation of the vagina. °· Itching and irritation of the area outside the vagina. °Symptoms of trichomoniasis in men include:  °· Penile discharge with or without pain. °· Pain during urination. This results from inflammation of the urethra. °DIAGNOSIS  °Trichomoniasis may be found during a Pap test or physical exam. Your health care provider may use one of the following methods to help diagnose this infection: °· Examining vaginal discharge under a microscope. For men, urethral discharge would be examined. °· Testing the pH of the vagina with a test tape. °· Using a vaginal swab test that checks for the Trichomonas organism. A test is available that provides results within a few minutes. °· Doing a culture test for the organism. This is not usually   needed. °TREATMENT  °· You may be given medicine to fight the infection. Women should inform their health care provider if they could be or are pregnant. Some medicines used to treat the infection should not be taken during pregnancy. °· Your health care provider may recommend over-the-counter medicines or creams to  decrease itching or irritation. °· Your sexual partner will need to be treated if infected. °HOME CARE INSTRUCTIONS  °· Take medicines only as directed by your health care provider. °· Take over-the-counter medicine for itching or irritation as directed by your health care provider. °· Do not have sexual intercourse while you have the infection. °· Women should not douche or wear tampons while they have the infection. °· Discuss your infection with your partner. Your partner may have gotten the infection from you, or you may have gotten it from your partner. °· Have your sex partner get examined and treated if necessary. °· Practice safe, informed, and protected sex. °· See your health care provider for other STI testing. °SEEK MEDICAL CARE IF:  °· You still have symptoms after you finish your medicine. °· You develop abdominal pain. °· You have pain when you urinate. °· You have bleeding after sexual intercourse. °· You develop a rash. °· Your medicine makes you sick or makes you throw up (vomit). °MAKE SURE YOU: °· Understand these instructions. °· Will watch your condition. °· Will get help right away if you are not doing well or get worse. °Document Released: 12/11/2000 Document Revised: 11/01/2013 Document Reviewed: 03/29/2013 °ExitCare® Patient Information ©2015 [employer]. This information is not intended to replace advice given to you by your health care provider. Make sure you discuss any questions you have with your health care provider. ° °

## 2015-03-15 NOTE — ED Provider Notes (Signed)
CSN: [id number]     Arrival date & time 03/15/15  1739 History   First MD Initiated Contact with Patient 03/15/15 1805     Chief Complaint  Patient presents with  . Abdominal Pain   (Consider location/radiation/quality/duration/timing/severity/associated sxs/prior Treatment) Patient is a 34 y.o. female presenting with abdominal pain. The history is provided by the patient.  Abdominal Pain Pain location:  Periumbilical Pain quality: sharp   Pain radiates to:  Does not radiate Pain severity:  Moderate Onset quality:  Sudden Duration:  4 days Progression:  Unchanged Chronicity:  New Context comment:  Onset with menses Worsened by:  Eating Associated symptoms: nausea, vaginal bleeding and vomiting   Associated symptoms: no constipation, no diarrhea, no dysuria, no fever, no hematuria and no vaginal discharge   Associated symptoms comment:  Heavier than nl menses using tampons and pads, nl menses is 3 days duration Risk factors: not pregnant     Past Medical History  Diagnosis Date  . Asthma   . Bronchitis    History reviewed. No pertinent past surgical history. No family history on file. Social History  Substance Use Topics  . Smoking status: Current Every Day Smoker  . Smokeless tobacco: None  . Alcohol Use: No   OB History    No data available     Review of Systems  Constitutional: Negative for fever.  Gastrointestinal: Positive for nausea, vomiting and abdominal pain. Negative for diarrhea and constipation.  Genitourinary: Positive for vaginal bleeding and menstrual problem. Negative for dysuria, frequency, hematuria, flank pain, vaginal discharge, vaginal pain and dyspareunia.  All other systems reviewed and are negative.   Allergies  Review of patient's allergies indicates no known allergies.  Home Medications   Prior to Admission medications   Medication Sig Start Date End Date Taking? Authorizing Provider  amoxicillin (AMOXIL) 875 MG tablet Take 1 tablet  (875 mg total) by mouth 2 (two) times daily. Patient not taking: Reported on 03/15/2015 11/08/13   Graylon Good, PA-C  Aspirin-Acetaminophen-Caffeine (GOODY HEADACHE PO) Take 1 Package by mouth every 6 (six) hours as needed (pain).    Historical Provider, MD  fluticasone (FLONASE) 50 MCG/ACT nasal spray Place 2 sprays into both nostrils 2 (two) times daily. Decrease to 2 sprays/nostril daily after 5 days 11/08/13   Graylon Good, PA-C  predniSONE (DELTASONE) 10 MG tablet 4 tabs PO QD for 4 days; 3 tabs PO QD for 3 days; 2 tabs PO QD for 2 days; 1 tab PO QD for 1 day 11/08/13   Graylon Good, PA-C   Meds Ordered and Administered this Visit  Medications - No data to display  BP 117/82 mmHg  Pulse 78  Temp(Src) 98.7 F (37.1 C) (Oral)  Resp 16  SpO2 99%  LMP 03/15/2015 No data found.   Physical Exam  Constitutional: She is oriented to person, place, and time. She appears well-developed and well-nourished. No distress.  Cardiovascular: Normal heart sounds.   Pulmonary/Chest: Breath sounds normal.  Abdominal: Soft. Normal appearance and bowel sounds are normal. She exhibits no distension and no mass. There is no hepatosplenomegaly. There is tenderness in the periumbilical area. There is no rigidity, no rebound, no guarding, no CVA tenderness, no tenderness at McBurney's point and negative Murphy's sign.  Lymphadenopathy:    She has no cervical adenopathy.  Neurological: She is alert and oriented to person, place, and time.  Skin: Skin is warm and dry.  Nursing note and vitals reviewed.   ED Course  Procedures (including critical care time)  Labs Review Labs Reviewed  POCT URINALYSIS DIP (DEVICE) - Abnormal; Notable for the following:    Hgb urine dipstick MODERATE (*)    Urobilinogen, UA 2.0 (*)    Leukocytes, UA TRACE (*)    All other components within normal limits  POCT PREGNANCY, URINE  upreg neg.  Imaging Review No results found.   Visual Acuity Review  Right Eye  Distance:   Left Eye Distance:   Bilateral Distance:    Right Eye Near:   Left Eye Near:    Bilateral Near:         MDM   1. Periumbilical abdominal pain        Linna Hoff, MD 03/15/15 [phone]

## 2015-03-15 NOTE — MAU Note (Signed)
Pt sent over from urgent care with lower abd pain x 3 days

## 2015-03-16 LAB — GC/CHLAMYDIA PROBE AMP (~~LOC~~) NOT AT ARMC
Chlamydia: NEGATIVE
Neisseria Gonorrhea: NEGATIVE

## 2015-03-16 LAB — HIV ANTIBODY (ROUTINE TESTING W REFLEX): HIV Screen 4th Generation wRfx: NONREACTIVE

## 2015-05-11 ENCOUNTER — Encounter (HOSPITAL_COMMUNITY): Payer: Self-pay | Admitting: Emergency Medicine

## 2015-05-11 ENCOUNTER — Emergency Department (INDEPENDENT_AMBULATORY_CARE_PROVIDER_SITE_OTHER)
Admission: EM | Admit: 2015-05-11 | Discharge: 2015-05-11 | Disposition: A | Discharge status: Home / Self Care | Payer: Medicaid Other | Source: Home / Self Care | Attending: Family Medicine | Admitting: Family Medicine

## 2015-05-11 DIAGNOSIS — K047 Periapical abscess without sinus: Secondary | ICD-10-CM | POA: Diagnosis not present

## 2015-05-11 MED ORDER — TRAMADOL HCL 50 MG PO TABS: 50.0000 mg | ORAL_TABLET | Freq: Four times a day (QID) | ORAL | Status: AC | PRN

## 2015-05-11 MED ORDER — AMOXICILLIN-POT CLAVULANATE 875-125 MG PO TABS: 1.0000 | ORAL_TABLET | Freq: Two times a day (BID) | ORAL | Status: DC

## 2015-05-11 NOTE — ED Notes (Signed)
Pt has been suffering from dental pain on her left upper side since Monday.  The pain is now radiating to her left ear.

## 2015-05-11 NOTE — Discharge Instructions (Signed)
It was a pleasure to see you today.   For the suspected dental infection, I am prescribing you AUGMENTIN 875/125 tablets, take 1 tablet by mouth twice daily for 14 days.   Tramadol 50mg  tablets, take 1-2 tablets by mouth every 6 hours as needed for severe pain.   Please return if you experience fevers/chills, worsening pain, or are unable to eat/drink.   Definitive treatment is needed from your dentist, so it is important that you keep your dental appointment on December 14th.

## 2015-05-11 NOTE — ED Provider Notes (Addendum)
CSN: [id number]     Arrival date & time 05/11/15  1439 History   None    No chief complaint on file.  (Consider location/radiation/quality/duration/timing/severity/associated sxs/prior Treatment) The history is provided by the patient. No language interpreter was used.  Patient presents with complaint of R sided upper molar pain, radiating to R ear since Nov 7th.  No fevers or chills. Has had to chew on the L side.  Has taken advil and tylenol without relief.  She called her dentist and was told next-available appointment is December 14th.    No cough, no nasal congestion, no otorrhea.   Past Medical History  Diagnosis Date  . Asthma   . Bronchitis    Past Surgical History  Procedure Laterality Date  . Wisdom tooth extraction     No family history on file. Social History  Substance Use Topics  . Smoking status: Current Some Day Smoker    Types: Cigars  . Smokeless tobacco: Not on file  . Alcohol Use: No   OB History    Gravida Para Term Preterm AB TAB SAB Ectopic Multiple Living   Review of Systems  Allergies  Review of patient's allergies indicates no known allergies.  Home Medications   Prior to Admission medications   Medication Sig Start Date End Date Taking? Authorizing Provider  albuterol (PROVENTIL HFA;VENTOLIN HFA) 108 (90 BASE) MCG/ACT inhaler Inhale 1-2 puffs into the lungs every 6 (six) hours as needed for wheezing or shortness of breath.    Historical Provider, MD  ibuprofen (ADVIL,MOTRIN) 200 MG tablet Take 200 mg by mouth every 6 (six) hours as needed for moderate pain.    Historical Provider, MD   Meds Ordered and Administered this Visit  Medications - No data to display  BP 107/72 mmHg  Pulse 78  Temp(Src) 98.7 F (37.1 C) (Oral)  Resp 16  SpO2 98% No data found.   Physical Exam  Constitutional: She appears well-developed and well-nourished. No distress.  Well appearing; mild distress. Clenching jaw slightly during  discussion but able to open easily during exam.   HENT:  Head: Normocephalic and atraumatic.  Right Ear: External ear normal.  Left Ear: External ear normal.  Nose: Nose normal.  Mouth/Throat: Oropharynx is clear and moist. No oropharyngeal exudate.  Mild gingival erythema along R upper molars. Teeth appear intact.  No frontal or maxillary sinus tenderness. No visible redness of parotid salivary duct on R. No palpable mass or enlargement in parotid gland.   Eyes: Conjunctivae and EOM are normal. Pupils are equal, round, and reactive to light. Right eye exhibits no discharge. Left eye exhibits no discharge. No scleral icterus.  Neck: Normal range of motion. Neck supple.  Lymphadenopathy:    She has no cervical adenopathy.  Skin: She is not diaphoretic.    ED Course  Procedures (including critical care time)  Labs Review Labs Reviewed - No data to display  Imaging Review No results found.   Visual Acuity Review  Right Eye Distance:   Left Eye Distance:   Bilateral Distance:    Right Eye Near:   Left Eye Near:    Bilateral Near:         MDM  No diagnosis found. Patient with R upper molar pain, radiating to R ear. No evidence of ear pathology, or parotid gland stone. Plan to treat with antibiotics and tramadol for suspected odontogenic infection, plan to follow with  her primary doctor [facility] if worsening, if develops fevers/chills, or if unable to eat/drink.     Barbaraann BarthelJames O Noami Bove, MD 05/11/15 1625  Barbaraann BarthelJames O Conal Shetley, MD 05/11/15 [phone]

## 2016-03-26 IMAGING — US US TRANSVAGINAL NON-OB
1 series · 15 of 25 positions shown · non-contrast
Comparison: None

CLINICAL DATA: Pelvic pain for 3 days.

EXAM:
TRANSABDOMINAL AND TRANSVAGINAL ULTRASOUND OF PELVIS
TECHNIQUE: Both transabdominal and transvaginal ultrasound examinations of the
pelvis were performed. Transabdominal technique was performed for
global imaging of the pelvis including uterus, ovaries, adnexal
regions, and pelvic cul-de-sac. It was necessary to proceed with
endovaginal exam following the transabdominal exam to visualize the
endometrium, left and right ovary.

[Series 1: us transvaginal non-ob · 15 of 41 slices shown]
[im 1/41]
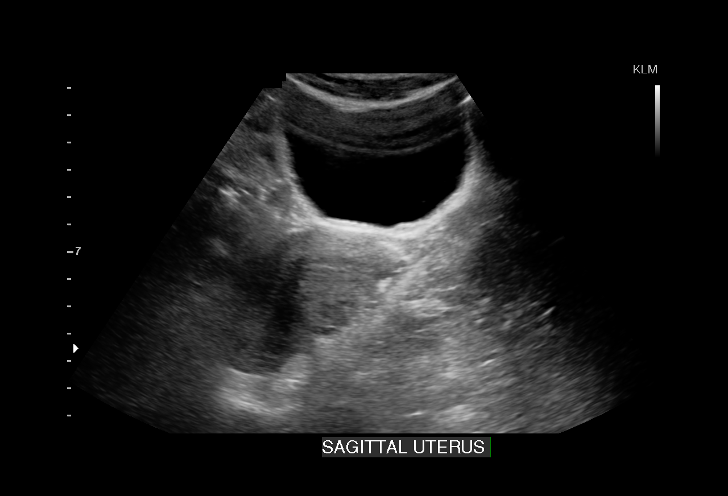
[im 4/41]
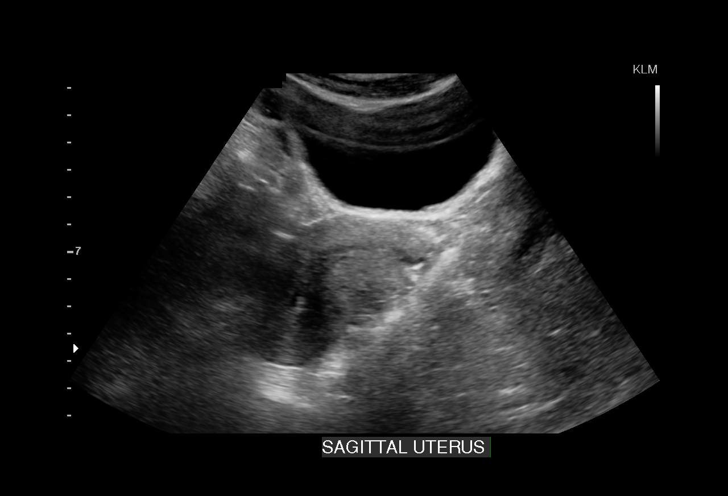
[im 7/41]
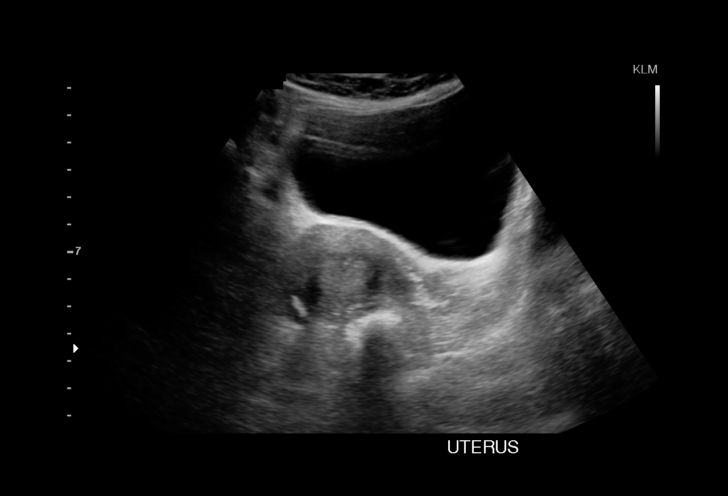
[im 9/41]
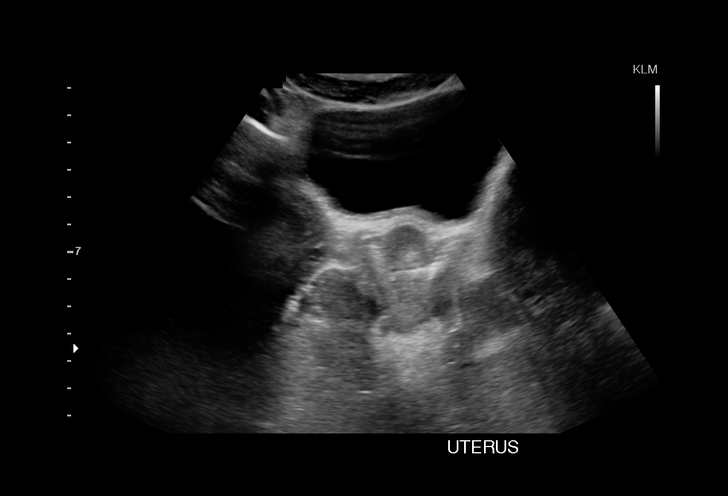
[im 12/41]
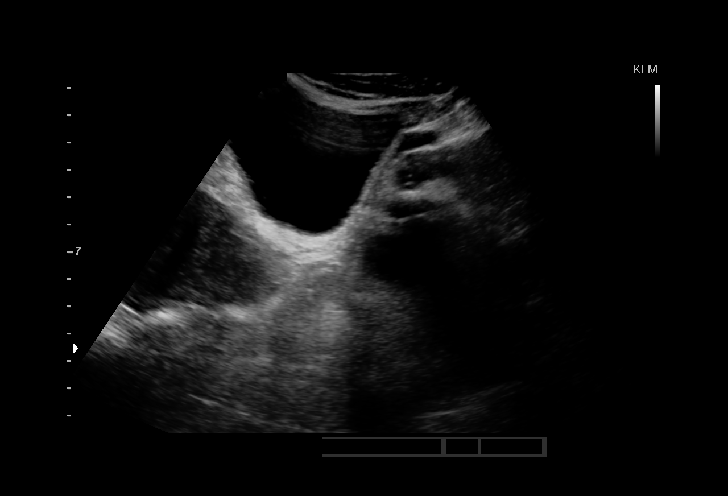
[im 16/41]
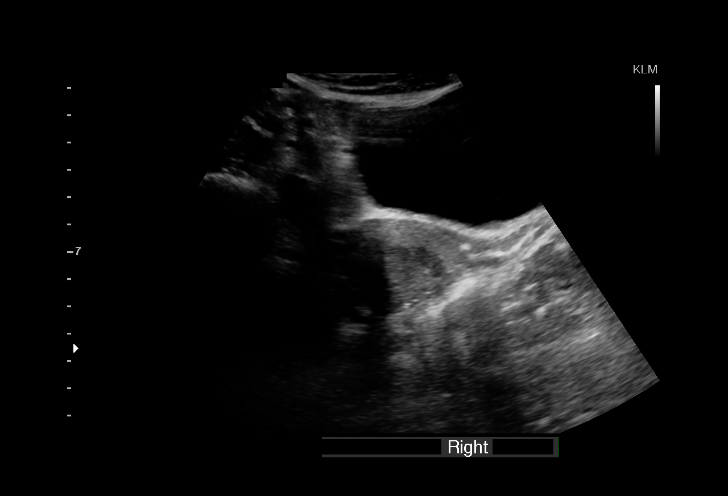
[im 17/41]
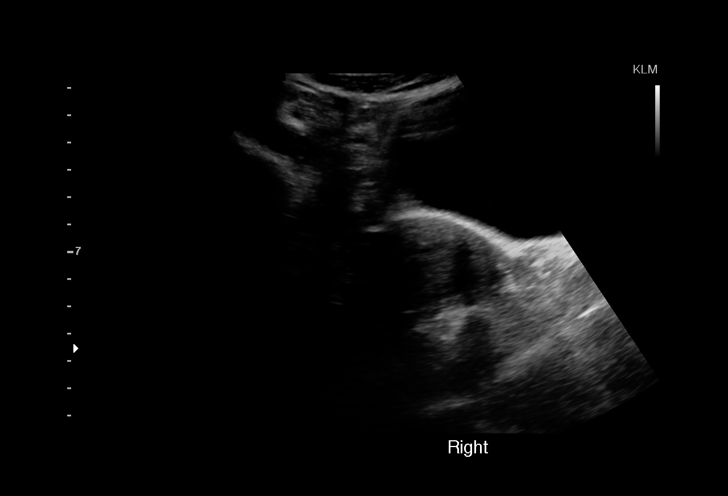
[im 21/41]
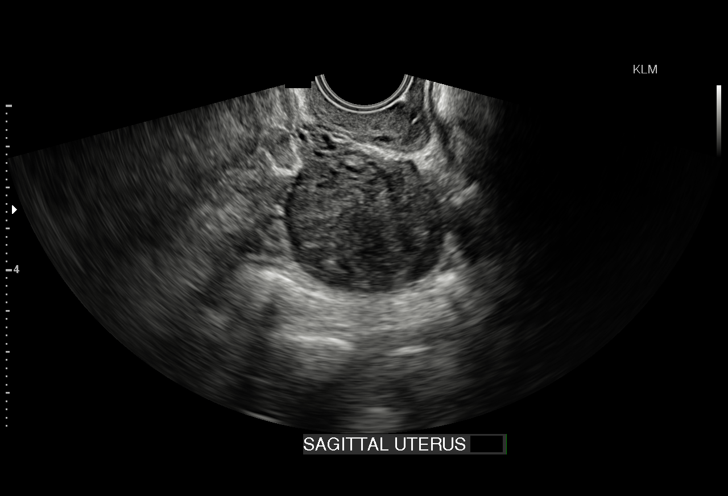
[im 24/41]
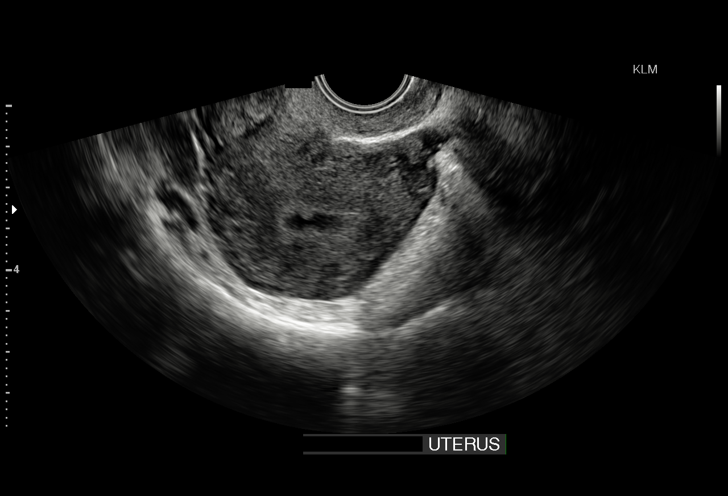
[im 26/41]
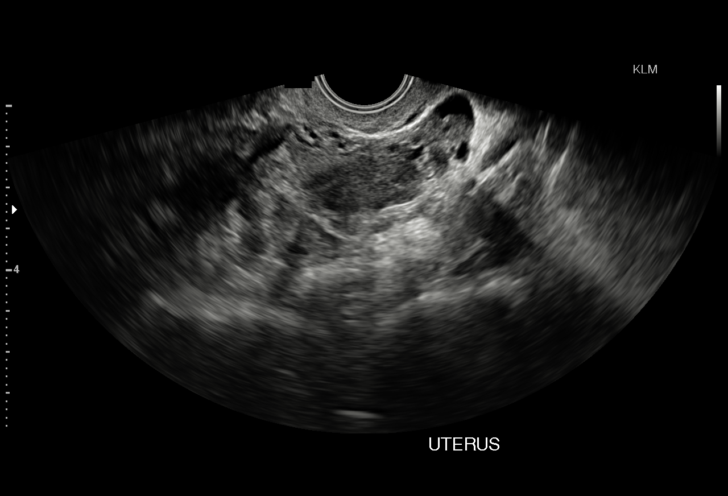
[im 29/41]
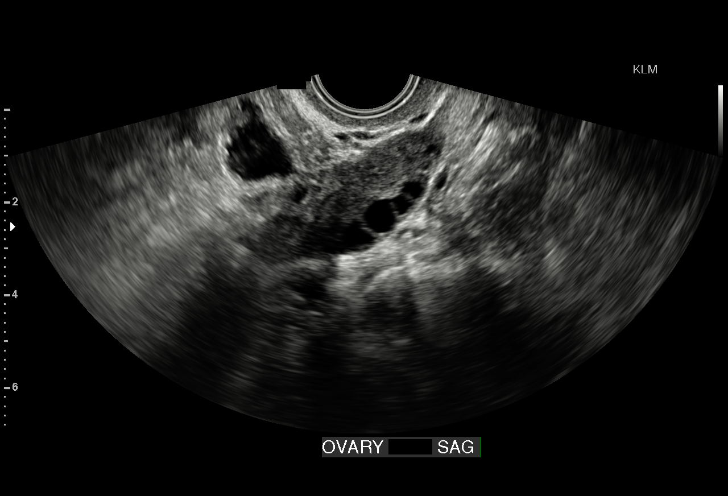
[im 32/41]
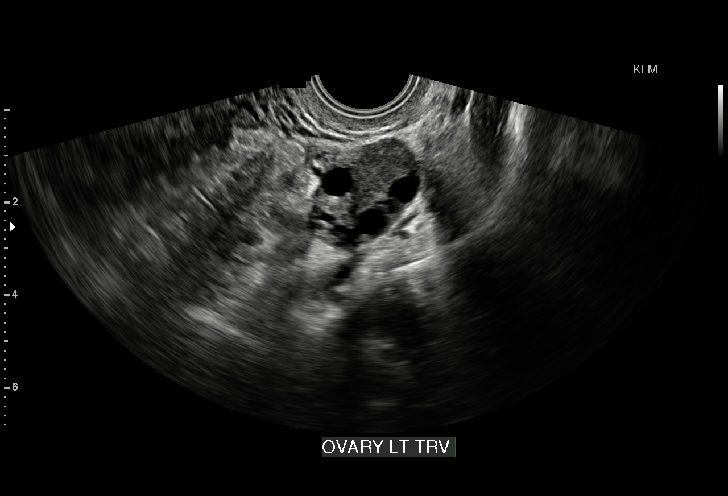
[im 34/41]
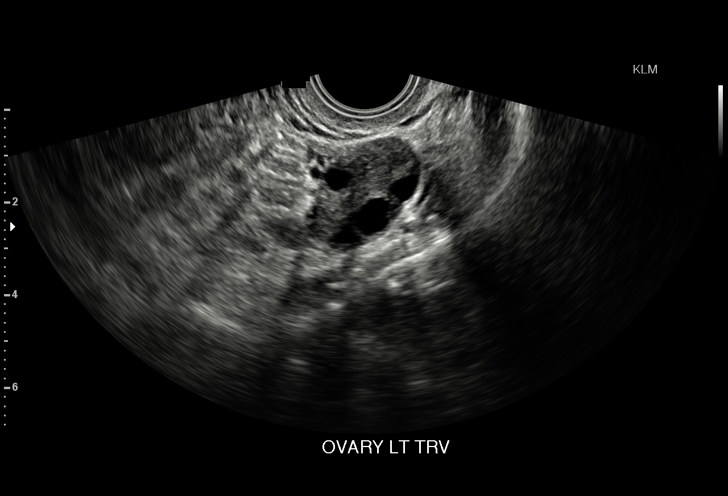
[im 37/41]
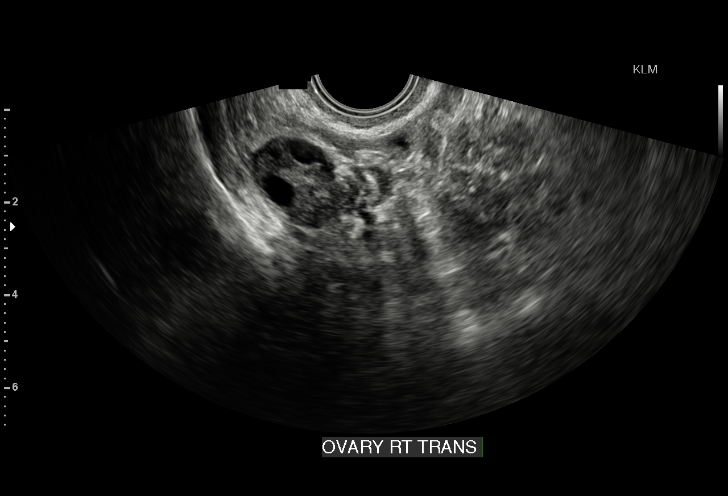
[im 41/41]
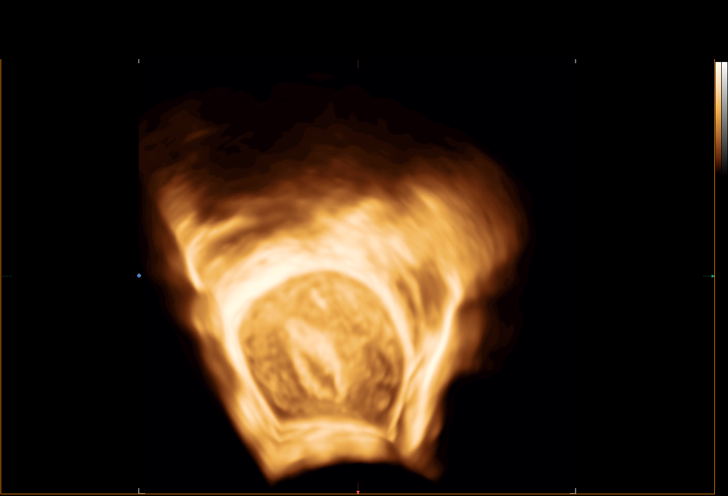

[15 of 25 positions shown; findings below may reference images not displayed]

FINDINGS: Uterus

Measurements: 6.1 x 4.6 x 4.7 cm. No fibroids or other mass
visualized. The uterus is retroverted.

Endometrium

Thickness: 10 mm.  No focal abnormality visualized.

Right ovary

Measurements: 3.5 x 1.6 x 1.6 cm. There are physiologic follicles.
Blood flow is noted. No adnexal mass.

Left ovary

Measurements: 4.3 x 1.8 x 2.0 cm. There are physiologic follicles.
Blood flow is noted. No adnexal mass.

Other findings

No free fluid.
IMPRESSION: Normal pelvic ultrasound.

## 2022-01-20 ENCOUNTER — Encounter (HOSPITAL_COMMUNITY): Payer: Self-pay | Admitting: Emergency Medicine

## 2022-01-20 ENCOUNTER — Emergency Department (HOSPITAL_COMMUNITY)
Admission: EM | Admit: 2022-01-20 | Discharge: 2022-01-21 | Discharge status: Against Medical Advice | Payer: Medicaid Other | Attending: Emergency Medicine | Admitting: Emergency Medicine

## 2022-01-20 ENCOUNTER — Other Ambulatory Visit: Payer: Self-pay

## 2022-01-20 DIAGNOSIS — J3489 Other specified disorders of nose and nasal sinuses: Secondary | ICD-10-CM | POA: Insufficient documentation

## 2022-01-20 DIAGNOSIS — R220 Localized swelling, mass and lump, head: Secondary | ICD-10-CM | POA: Diagnosis not present

## 2022-01-20 DIAGNOSIS — H5711 Ocular pain, right eye: Secondary | ICD-10-CM | POA: Insufficient documentation

## 2022-01-20 DIAGNOSIS — Z5321 Procedure and treatment not carried out due to patient leaving prior to being seen by health care provider: Secondary | ICD-10-CM | POA: Insufficient documentation

## 2022-01-20 NOTE — ED Triage Notes (Signed)
Patient reports right eye pain with sinus pressure onset yesterday , denies injury /no vision loss.

## 2022-01-21 ENCOUNTER — Ambulatory Visit
Admission: EM | Admit: 2022-01-21 | Discharge: 2022-01-21 | Disposition: A | Discharge status: Home / Self Care | Payer: Medicaid Other | Attending: Internal Medicine | Admitting: Internal Medicine

## 2022-01-21 ENCOUNTER — Inpatient Hospital Stay: Admit: 2022-01-21 | Discharge: 2022-01-21 | Disposition: A | Discharge status: Home / Self Care | Payer: Medicaid Other

## 2022-01-21 ENCOUNTER — Encounter: Payer: Self-pay | Admitting: Emergency Medicine

## 2022-01-21 DIAGNOSIS — R0981 Nasal congestion: Secondary | ICD-10-CM | POA: Diagnosis not present

## 2022-01-21 DIAGNOSIS — J3489 Other specified disorders of nose and nasal sinuses: Secondary | ICD-10-CM | POA: Diagnosis not present

## 2022-01-21 MED ORDER — PREDNISONE 20 MG PO TABS: 40.0000 mg | ORAL_TABLET | Freq: Every day | ORAL | 0 refills | Status: AC

## 2022-01-21 MED ORDER — AMOXICILLIN 875 MG PO TABS: 875.0000 mg | ORAL_TABLET | Freq: Two times a day (BID) | ORAL | 0 refills | Status: AC

## 2022-01-21 MED ORDER — OXYCODONE-ACETAMINOPHEN 5-325 MG PO TABS
1.0000 | ORAL_TABLET | Freq: Once | ORAL | Status: AC
  Administered 2022-01-21: 1 via ORAL
  Filled 2022-01-21: qty 1

## 2022-01-21 NOTE — Discharge Instructions (Signed)
It appears that you have a sinus infection.  You have been prescribed a medication to take for symptoms.  Please go to the ER if symptoms persist or worsen.

## 2022-01-21 NOTE — ED Triage Notes (Signed)
Pt is present today with c/o sinus pressure, HA, and nasal congestion. Pt sx started x2 days ago

## 2022-01-21 NOTE — ED Provider Triage Note (Signed)
  Emergency Medicine Provider Triage Evaluation Note  MRN:  [id number]  Arrival date & time: 01/21/22    Medically screening exam initiated at 12:04 AM.   CC:   Eye Pain / Sinus Pressure   HPI:  Caitlin Owens is a 41 y.o. year-old female presents to the ED with chief complaint of right eye pain and pressure.  Onset yesterday. She thinks it is a sinus infection, but denies recent illness.  Denies fever.  History provided by History provided by patient. ROS:  -As included in HPI PE:   Vitals:   01/20/22 2353  BP: (!) 145/93  Pulse: 78  Resp: 16  Temp: 99 F (37.2 C)  SpO2: 100%    Non-toxic appearing No respiratory distress Mild right sided facial swelling, ?developing zoster type rash MDM:  Based on signs and symptoms, zoster is highest on my differential, followed by sinusitis. I've ordered pain meds in triage to expedite lab/diagnostic workup.  Patient was informed that the remainder of the evaluation will be completed by another provider, this initial triage assessment does not replace that evaluation, and the importance of remaining in the ED until their evaluation is complete.    Roxy Horseman, PA-C 01/21/22 0006

## 2022-01-21 NOTE — ED Notes (Signed)
Pt left [facility] due to long wait time.

## 2022-01-21 NOTE — ED Provider Notes (Signed)
[facility]    CSN: [id number] Arrival date & time: 01/21/22  1245      History   Chief Complaint Chief Complaint  Patient presents with   Facial Pain   Headache   Nasal Congestion    HPI Caitlin Owens is a 41 y.o. female.   Patient presents with headache, sinus pressure in the right side of the face, nasal congestion that started about 2 days ago.  Denies cough, fever, sore throat, ear pain, nausea, vomiting, diarrhea, abdominal pain.  Denies any known sick contacts.  Patient has not taken any medications.  Patient went to the ER yesterday but left before being seen by provider.   Headache   Past Medical History:  Diagnosis Date   Asthma    Bronchitis     There are no problems to display for this patient.   Past Surgical History:  Procedure Laterality Date   WISDOM TOOTH EXTRACTION      OB History     Gravida  4   Para  2   Term  2   Preterm      AB  2   Living  2      SAB  2   IAB      Ectopic      Multiple      Live Births               Home Medications    Prior to Admission medications   Medication Sig Start Date End Date Taking? Authorizing Provider  amoxicillin (AMOXIL) 875 MG tablet Take 1 tablet (875 mg total) by mouth 2 (two) times daily for 10 days. 01/21/22 01/31/22 Yes Godfrey Tritschler, Acie Fredrickson, FNP  predniSONE (DELTASONE) 20 MG tablet Take 2 tablets (40 mg total) by mouth daily for 5 days. 01/21/22 01/26/22 Yes Latresa Gasser, Acie Fredrickson, FNP  albuterol (PROVENTIL HFA;VENTOLIN HFA) 108 (90 BASE) MCG/ACT inhaler Inhale 1-2 puffs into the lungs every 6 (six) hours as needed for wheezing or shortness of breath.    [provider]  ibuprofen (ADVIL,MOTRIN) 200 MG tablet Take 200 mg by mouth every 6 (six) hours as needed for moderate pain.    [provider]  traMADol (ULTRAM) 50 MG tablet Take 1 tablet (50 mg total) by mouth every 6 (six) hours as needed. 05/11/15   Barbaraann Barthel, MD    Family History History reviewed.  No pertinent family history.  Social History Social History   Tobacco Use   Smoking status: Former    Types: Cigars  Substance Use Topics   Alcohol use: No   Drug use: No     Allergies   Patient has no known allergies.   Review of Systems Review of Systems Per HPI  Physical Exam Triage Vital Signs ED Triage Vitals  Enc Vitals Group     BP 01/21/22 1405 127/87     Pulse Rate 01/21/22 1405 87     Resp 01/21/22 1405 17     Temp 01/21/22 1405 98.6 F (37 C)     Temp src --      SpO2 01/21/22 1405 98 %     Weight --      Height --      Head Circumference --      Peak Flow --      Pain Score 01/21/22 1404 10     Pain Loc --      Pain Edu? --      Excl. in GC? --  No data found.  Updated Vital Signs BP 127/87   Pulse 87   Temp 98.6 F (37 C)   Resp 17   SpO2 98%   Visual Acuity Right Eye Distance:   Left Eye Distance:   Bilateral Distance:    Right Eye Near:   Left Eye Near:    Bilateral Near:     Physical Exam Constitutional:      General: She is not in acute distress.    Appearance: Normal appearance. She is not toxic-appearing or diaphoretic.  HENT:     Head: Normocephalic and atraumatic.     Right Ear: Tympanic membrane and ear canal normal.     Left Ear: Tympanic membrane and ear canal normal.     Nose: Congestion present.     Right Sinus: Maxillary sinus tenderness and frontal sinus tenderness present.     Left Sinus: No maxillary sinus tenderness or frontal sinus tenderness.     Mouth/Throat:     Mouth: Mucous membranes are moist.     Pharynx: No posterior oropharyngeal erythema.  Eyes:     General: Lids are normal. Lids are everted, no foreign bodies appreciated.     Extraocular Movements: Extraocular movements intact.     Conjunctiva/sclera: Conjunctivae normal.     Pupils: Pupils are equal, round, and reactive to light.  Cardiovascular:     Rate and Rhythm: Normal rate and regular rhythm.     Pulses: Normal pulses.     Heart  sounds: Normal heart sounds.  Pulmonary:     Effort: Pulmonary effort is normal. No respiratory distress.     Breath sounds: Normal breath sounds. No wheezing.  Abdominal:     General: Abdomen is flat. Bowel sounds are normal.     Palpations: Abdomen is soft.  Musculoskeletal:        General: Normal range of motion.     Cervical back: Normal range of motion.  Skin:    General: Skin is warm and dry.  Neurological:     General: No focal deficit present.     Mental Status: She is alert and oriented to person, place, and time. Mental status is at baseline.     Cranial Nerves: Cranial nerves 2-12 are intact.     Sensory: Sensation is intact.     Motor: Motor function is intact.     Coordination: Coordination is intact.     Gait: Gait is intact.  Psychiatric:        Mood and Affect: Mood normal.        Behavior: Behavior normal.      UC Treatments / Results  Labs (all labs ordered are listed, but only abnormal results are displayed) Labs Reviewed - No data to display  EKG   Radiology No results found.  Procedures Procedures (including critical care time)  Medications Ordered in UC Medications - No data to display  Initial Impression / Assessment and Plan / UC Course  I have reviewed the triage vital signs and the nursing notes.  Pertinent labs & imaging results that were available during my care of the patient were reviewed by me and considered in my medical decision making (see chart for details).     Symptoms appear consistent with viral upper respiratory infection versus sinus infection.  Patient appears to have significant sinus pressure.  Do not think that imaging of the head is necessary given no obvious recent injury and neuro exam is normal.  Will treat with prednisone to decrease  inflammation and sinus pressure as well as antibiotic given how significant sinus pressure is.  Discussed supportive care with patient as well.  Patient advised to go to the ER if  symptoms persist or worsen.  Patient verbalized understanding and was agreeable with plan. Final Clinical Impressions(s) / UC Diagnoses   Final diagnoses:  Sinus pressure  Nasal congestion     Discharge Instructions      It appears that you have a sinus infection.  You have been prescribed a medication to take for symptoms.  Please go to the ER if symptoms persist or worsen.    ED Prescriptions     Medication Sig Dispense Auth. Provider   amoxicillin (AMOXIL) 875 MG tablet Take 1 tablet (875 mg total) by mouth 2 (two) times daily for 10 days. 20 tablet [place], Flora E, [place]   predniSONE (DELTASONE) 20 MG tablet Take 2 tablets (40 mg total) by mouth daily for 5 days. 10 tablet Gustavus Bryant, [place]      PDMP not reviewed this encounter.   Gustavus Bryant, [place] 01/21/22 1436
